# Patient Record
Sex: Female | Born: 2000 | Race: Black or African American | Hispanic: No | Marital: Single | Smoking: Never smoker
Health system: Southern US, Community
[De-identification: ages and names within clinical notes are randomized; demographics above are authoritative.]

## PROBLEM LIST (undated history)

## (undated) DIAGNOSIS — J45909 Unspecified asthma, uncomplicated: Secondary | ICD-10-CM

## (undated) DIAGNOSIS — Q181 Preauricular sinus and cyst: Secondary | ICD-10-CM

## (undated) DIAGNOSIS — Z8489 Family history of other specified conditions: Secondary | ICD-10-CM

## (undated) HISTORY — DX: Unspecified asthma, uncomplicated: J45.909

---
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
## 2024-01-21 ENCOUNTER — Other Ambulatory Visit: Payer: Self-pay

## 2024-01-21 ENCOUNTER — Ambulatory Visit
Admission: EM | Admit: 2024-01-21 | Discharge: 2024-01-21 | Disposition: A | Discharge status: Other Acute Inpatient Hospital | Payer: BC Managed Care – PPO

## 2024-01-21 DIAGNOSIS — R4182 Altered mental status, unspecified: Secondary | ICD-10-CM

## 2024-01-21 LAB — POCT FASTING CBG KUC MANUAL ENTRY: POCT Glucose (KUC): 80 mg/dL (ref 70–99)

## 2024-01-21 NOTE — ED Notes (Signed)
@   1010 EMS called  @ 1043 EMS arrived

## 2024-01-21 NOTE — ED Notes (Signed)
Patient is being discharged from the Urgent Care and sent to the Emergency Department via EMS . Per Glo Herring patient is in need of higher level of care due to altered mental status. Patient is aware and verbalizes understanding of plan of care. There were no vitals filed for this visit.

## 2024-01-21 NOTE — ED Provider Notes (Signed)
Patient presented to urgent care today with complaints of feeling worse after diagnosed with the flu 2 weeks ago.  During registration, patient became lethargic, sat down, states she cannot move her body.  She is tearful.  Vital signs are stable, she is slightly tachycardic, SpO2 is normal on room air.  CBG is not low.  On exam, she is responsive to external stimuli and answering my questions appropriately.  Pupils are equal and reactive.  Lungs are clear to auscultation bilaterally.  Patient appears lethargic and tearful.  Mom reports patient takes sertraline daily, no other known medicines.  Mom states she has never had an episode like this before.  No new medicines or known ingestions.    I recommended further evaluation and management in the emergency room.  Mom and patient are in agreement to this plan.  EMS was called.  Approximately 20 minutes later, EMS still not arrived.  Recheck of patient shows she is more lethargic, responsive to painful stimuli minimally.  Vitals remained stable.  EMS called again for update-they are and route.  Updated that patient's condition is worsening.  Patient transported by EMS to nearest emergency room.  Patient left urgent care in stable condition.   Valentino Nose, NP 01/21/24 1058

## 2024-01-21 NOTE — ED Triage Notes (Signed)
Sick with flu like symptoms x 3-4 days, feeling stiff now. Can't get in wheelchair. Np martinez notified and called to lobby.
